# Patient Record
Sex: Male | Born: 2000 | Race: Black or African American | Hispanic: No | Marital: Single | Smoking: Never smoker
Health system: Southern US, Community
[De-identification: ages and names within clinical notes are randomized; demographics above are authoritative.]

## PROBLEM LIST (undated history)

## (undated) DIAGNOSIS — F909 Attention-deficit hyperactivity disorder, unspecified type: Secondary | ICD-10-CM

## (undated) HISTORY — DX: Attention-deficit hyperactivity disorder, unspecified type: F90.9

---
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
